# Patient Record
Sex: Female | Born: 2000 | Hispanic: Yes | Marital: Single | Smoking: Never smoker
Health system: Southern US, Community
[De-identification: ages and names within clinical notes are randomized; demographics above are authoritative.]

## PROBLEM LIST (undated history)

## (undated) HISTORY — PX: KIDNEY SURGERY: SHX687

---
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
## 2023-04-01 LAB — PANORAMA PRENATAL TEST FULL PANEL:PANORAMA TEST PLUS 5 ADDITIONAL MICRODELETIONS: FETAL FRACTION: 3.4

## 2024-02-20 ENCOUNTER — Other Ambulatory Visit: Payer: Self-pay

## 2024-02-20 ENCOUNTER — Emergency Department
Admission: EM | Admit: 2024-02-20 | Discharge: 2024-02-21 | Disposition: A | Discharge status: Home / Self Care | Attending: Emergency Medicine | Admitting: Emergency Medicine

## 2024-02-20 DIAGNOSIS — K529 Noninfective gastroenteritis and colitis, unspecified: Secondary | ICD-10-CM | POA: Insufficient documentation

## 2024-02-20 DIAGNOSIS — R1031 Right lower quadrant pain: Secondary | ICD-10-CM | POA: Diagnosis present

## 2024-02-20 LAB — CBC
HCT: 40.9 % (ref 36.0–46.0)
Hemoglobin: 13.5 g/dL (ref 12.0–15.0)
MCH: 28.4 pg (ref 26.0–34.0)
MCHC: 33 g/dL (ref 30.0–36.0)
MCV: 86.1 fL (ref 80.0–100.0)
Platelets: 427 10*3/uL — ABNORMAL HIGH (ref 150–400)
RBC: 4.75 MIL/uL (ref 3.87–5.11)
RDW: 14.5 % (ref 11.5–15.5)
WBC: 13.9 10*3/uL — ABNORMAL HIGH (ref 4.0–10.5)
nRBC: 0 % (ref 0.0–0.2)

## 2024-02-20 LAB — COMPREHENSIVE METABOLIC PANEL WITH GFR
ALT: 22 U/L (ref 0–44)
AST: 16 U/L (ref 15–41)
Albumin: 4.2 g/dL (ref 3.5–5.0)
Alkaline Phosphatase: 82 U/L (ref 38–126)
Anion gap: 10 (ref 5–15)
BUN: 15 mg/dL (ref 6–20)
CO2: 21 mmol/L — ABNORMAL LOW (ref 22–32)
Calcium: 8.6 mg/dL — ABNORMAL LOW (ref 8.9–10.3)
Chloride: 102 mmol/L (ref 98–111)
Creatinine, Ser: 0.67 mg/dL (ref 0.44–1.00)
GFR, Estimated: >60 mL/min (ref >60)
Glucose, Bld: 107 mg/dL — ABNORMAL HIGH (ref 70–99)
Potassium: 3.6 mmol/L (ref 3.5–5.1)
Sodium: 133 mmol/L — ABNORMAL LOW (ref 135–145)
Total Bilirubin: 0.9 mg/dL (ref 0.0–1.2)
Total Protein: 8 g/dL (ref 6.5–8.1)

## 2024-02-20 LAB — URINALYSIS, ROUTINE W REFLEX MICROSCOPIC
Bilirubin Urine: NEGATIVE
Glucose, UA: NEGATIVE mg/dL
Hgb urine dipstick: NEGATIVE
Ketones, ur: NEGATIVE mg/dL
Nitrite: NEGATIVE
Protein, ur: NEGATIVE mg/dL
Specific Gravity, Urine: 1.026 (ref 1.005–1.030)
pH: 5 (ref 5.0–8.0)

## 2024-02-20 LAB — POC URINE PREG, ED: Preg Test, Ur: NEGATIVE

## 2024-02-20 LAB — LIPASE, BLOOD: Lipase: 24 U/L (ref 11–51)

## 2024-02-20 MED ORDER — ONDANSETRON HCL 4 MG/2ML IJ SOLN
4.0000 mg | Freq: Once | INTRAMUSCULAR | Status: AC
  Administered 2024-02-20: 4 mg via INTRAVENOUS
  Filled 2024-02-20: qty 2

## 2024-02-20 MED ORDER — KETOROLAC TROMETHAMINE 15 MG/ML IJ SOLN
15.0000 mg | Freq: Once | INTRAMUSCULAR | Status: AC
  Administered 2024-02-20: 15 mg via INTRAVENOUS
  Filled 2024-02-20: qty 1

## 2024-02-20 MED ORDER — ONDANSETRON 4 MG PO TBDP
4.0000 mg | ORAL_TABLET | Freq: Once | ORAL | Status: AC
  Administered 2024-02-20: 4 mg via ORAL
  Filled 2024-02-20: qty 1

## 2024-02-20 MED ORDER — SODIUM CHLORIDE 0.9 % IV BOLUS
1000.0000 mL | Freq: Once | INTRAVENOUS | Status: AC
  Administered 2024-02-20: 1000 mL via INTRAVENOUS

## 2024-02-20 NOTE — ED Triage Notes (Signed)
 Patient C/O vomiting, diarrhea, and frank rectal bleeding after wiping that began this morning.

## 2024-02-20 NOTE — ED Notes (Signed)
 EDP Bradler and pt primary RN notified of pain and nausea.

## 2024-02-20 NOTE — ED Provider Notes (Signed)
 [facility] Provider Note    Event Date/Time   First MD Initiated Contact with Patient 02/20/24 2330     (approximate)   History   Chief Complaint: Emesis, Diarrhea, and Rectal Bleeding   HPI  Sherry Blackburn is a 23 y.o. female with no significant past medical history who comes ED complaining of right lower quadrant abdominal pain along with nausea vomiting diarrhea that all started this morning.  No chest pain or shortness of breath.  Also reports seeing blood in her stool today.        No past medical history on file.  Current Outpatient Rx   Order #: [id number] Class: Normal   Order #: [id number] Class: Normal    Past Surgical History:  Procedure Laterality Date   KIDNEY SURGERY      Physical Exam   Triage Vital Signs: ED Triage Vitals  Encounter Vitals Group     BP 02/20/24 2136 128/74     Systolic BP Percentile --      Diastolic BP Percentile --      Pulse Rate 02/20/24 2136 (!) 124     Resp 02/20/24 2136 18     Temp 02/20/24 2136 99.6 F (37.6 C)     Temp Source 02/20/24 2136 Oral     SpO2 02/20/24 2136 98 %     Weight --      Height 02/20/24 2137 5\' 5"  (1.651 m)     Head Circumference --      Peak Flow --      Pain Score 02/20/24 2317 10     Pain Loc --      Pain Education --      Exclude from Growth Chart --     Most recent vital signs: Vitals:   02/20/24 2136 02/20/24 2317  BP: 128/74 120/71  Pulse: (!) 124 (!) 103  Resp: 18 (!) 22  Temp: 99.6 F (37.6 C)   SpO2: 98% 97%    General: Awake, no distress.  CV:  Good peripheral perfusion.  Tachycardia heart rate 100 Resp:  Normal effort.  Abd:  No distention.  Soft with diffuse right-sided abdominal tenderness    ED Results / Procedures / Treatments   Labs (all labs ordered are listed, but only abnormal results are displayed) Labs Reviewed  COMPREHENSIVE METABOLIC PANEL WITH GFR - Abnormal; Notable for the following components:      Result Value   Sodium 133  (*)    CO2 21 (*)    Glucose, Bld 107 (*)    Calcium 8.6 (*)    All other components within normal limits  CBC - Abnormal; Notable for the following components:   WBC 13.9 (*)    Platelets 427 (*)    All other components within normal limits  URINALYSIS, ROUTINE W REFLEX MICROSCOPIC - Abnormal; Notable for the following components:   Color, Urine YELLOW (*)    APPearance HAZY (*)    Leukocytes,Ua TRACE (*)    Bacteria, UA RARE (*)    All other components within normal limits  LIPASE, BLOOD  POC URINE PREG, ED     EKG    RADIOLOGY ***   PROCEDURES:  Procedures   MEDICATIONS ORDERED IN ED: Medications  ondansetron  (ZOFRAN ) injection 4 mg (has no administration in time range)  sodium chloride 0.9 % bolus 1,000 mL (has no administration in time range)  ketorolac  (TORADOL ) 15 MG/ML injection 15 mg (has no administration in time range)  ondansetron  (ZOFRAN -ODT)  disintegrating tablet 4 mg (4 mg Oral Given 02/20/24 2144)     IMPRESSION / MDM / ASSESSMENT AND PLAN / ED COURSE  I reviewed the triage vital signs and the nursing notes.  DDx: Appendicitis, cholecystitis, colitis, viral gastroenteritis, dehydration, AKI, electrolyte derangement, anemia, pregnancy  Patient's presentation is most consistent with acute presentation with potential threat to life or bodily function.  Patient presents with pain, nausea vomiting diarrhea.  Has tenderness on the right side, not focal to McBurney's point, but with her leukocytosis and pain level, will need IV fluids Zofran  Toradol  along with CT scan       FINAL CLINICAL IMPRESSION(S) / ED DIAGNOSES   Final diagnoses:  None     Rx / DC Orders   ED Discharge Orders     None        Note:  This document was prepared using Dragon voice recognition software and may include unintentional dictation errors.

## 2024-02-21 ENCOUNTER — Other Ambulatory Visit

## 2024-02-21 ENCOUNTER — Encounter: Payer: Self-pay | Admitting: Emergency Medicine

## 2024-02-21 ENCOUNTER — Emergency Department

## 2024-02-21 MED ORDER — IOHEXOL 300 MG/ML  SOLN
100.0000 mL | Freq: Once | INTRAMUSCULAR | Status: AC | PRN
  Administered 2024-02-21: 100 mL via INTRAVENOUS

## 2024-02-21 MED ORDER — ONDANSETRON 4 MG PO TBDP: 4.0000 mg | ORAL_TABLET | Freq: Three times a day (TID) | ORAL | 0 refills | Status: AC | PRN

## 2024-02-21 MED ORDER — FAMOTIDINE 20 MG PO TABS: 20.0000 mg | ORAL_TABLET | Freq: Two times a day (BID) | ORAL | 0 refills | Status: AC

## 2024-02-21 MED ORDER — NAPROXEN 500 MG PO TABS: 500.0000 mg | ORAL_TABLET | Freq: Two times a day (BID) | ORAL | 0 refills | Status: AC

## 2024-02-21 NOTE — ED Notes (Signed)
 Patient tolerated PO liquids well without n/v.  Provider notified.

## 2024-02-21 NOTE — ED Notes (Signed)
 Ginger ale given to patient for PO challenge.  Patient instructed to take small sips.
